# Patient Record
Sex: Male | Born: 1990 | Race: Black or African American | Hispanic: No | Marital: Single | Smoking: Never smoker
Health system: Southern US, Community
[De-identification: ages and names within clinical notes are randomized; demographics above are authoritative.]

---
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
## 2012-09-13 ENCOUNTER — Telehealth: Payer: Self-pay | Admitting: *Deleted

## 2012-09-13 ENCOUNTER — Ambulatory Visit: Payer: Self-pay | Admitting: Family Medicine

## 2012-09-13 DIAGNOSIS — Z0289 Encounter for other administrative examinations: Secondary | ICD-10-CM

## 2012-09-13 NOTE — Telephone Encounter (Signed)
No Show for New Pt Appt, 30 min.  I called only number on chart, asked him to call back with reason for missed appt.  Per Dr Caryl Never, if he does not call with a good reason, please block pt from re-scheduling with him.

## 2012-10-20 ENCOUNTER — Emergency Department (HOSPITAL_COMMUNITY): Payer: BC Managed Care – PPO

## 2012-10-20 ENCOUNTER — Emergency Department (HOSPITAL_COMMUNITY)
Admission: EM | Admit: 2012-10-20 | Discharge: 2012-10-20 | Disposition: A | Discharge status: Home / Self Care | Payer: BC Managed Care – PPO | Attending: Emergency Medicine | Admitting: Emergency Medicine

## 2012-10-20 ENCOUNTER — Encounter (HOSPITAL_COMMUNITY): Payer: Self-pay | Admitting: Emergency Medicine

## 2012-10-20 DIAGNOSIS — IMO0001 Reserved for inherently not codable concepts without codable children: Secondary | ICD-10-CM | POA: Insufficient documentation

## 2012-10-20 DIAGNOSIS — M25559 Pain in unspecified hip: Secondary | ICD-10-CM | POA: Insufficient documentation

## 2012-10-20 DIAGNOSIS — M25551 Pain in right hip: Secondary | ICD-10-CM

## 2012-10-20 MED ORDER — OXYCODONE-ACETAMINOPHEN 5-325 MG PO TABS
2.0000 | ORAL_TABLET | Freq: Once | ORAL | Status: AC
  Administered 2012-10-20: 2 via ORAL
  Filled 2012-10-20: qty 2

## 2012-10-20 MED ORDER — ONDANSETRON 4 MG PO TBDP
8.0000 mg | ORAL_TABLET | Freq: Once | ORAL | Status: AC
  Administered 2012-10-20: 8 mg via ORAL
  Filled 2012-10-20: qty 2

## 2012-10-20 MED ORDER — TRAMADOL HCL 50 MG PO TABS: 50.0000 mg | ORAL_TABLET | Freq: Four times a day (QID) | ORAL | Status: DC | PRN

## 2012-10-20 NOTE — ED Provider Notes (Signed)
Medical screening examination/treatment/procedure(s) were performed by non-physician practitioner and as supervising physician I was immediately available for consultation/collaboration.   Glynn Octave, MD 10/20/12 [phone]

## 2012-10-20 NOTE — ED Notes (Signed)
PT in xray; xray called to take pt to [facility].
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
## 2012-10-20 NOTE — ED Notes (Signed)
PT ambulated with baseline gait; VSS; A&Ox3; no signs of distress; respirations even and unlabored; skin warm and dry; no questions upon discharge.  
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
## 2013-02-05 ENCOUNTER — Emergency Department (HOSPITAL_COMMUNITY): Payer: BC Managed Care – PPO

## 2013-02-05 ENCOUNTER — Emergency Department (HOSPITAL_COMMUNITY)
Admission: EM | Admit: 2013-02-05 | Discharge: 2013-02-05 | Disposition: A | Discharge status: Home / Self Care | Payer: BC Managed Care – PPO | Attending: Emergency Medicine | Admitting: Emergency Medicine

## 2013-02-05 ENCOUNTER — Encounter (HOSPITAL_COMMUNITY): Payer: Self-pay | Admitting: Emergency Medicine

## 2013-02-05 DIAGNOSIS — S40012A Contusion of left shoulder, initial encounter: Secondary | ICD-10-CM

## 2013-02-05 DIAGNOSIS — Y9389 Activity, other specified: Secondary | ICD-10-CM | POA: Insufficient documentation

## 2013-02-05 DIAGNOSIS — S40019A Contusion of unspecified shoulder, initial encounter: Secondary | ICD-10-CM | POA: Insufficient documentation

## 2013-02-05 DIAGNOSIS — Y92410 Unspecified street and highway as the place of occurrence of the external cause: Secondary | ICD-10-CM | POA: Insufficient documentation

## 2013-02-05 MED ORDER — NAPROXEN 500 MG PO TABS: 500.0000 mg | ORAL_TABLET | Freq: Two times a day (BID) | ORAL | Status: DC

## 2013-02-05 MED ORDER — NAPROXEN 250 MG PO TABS
500.0000 mg | ORAL_TABLET | Freq: Once | ORAL | Status: AC
  Administered 2013-02-05: 500 mg via ORAL
  Filled 2013-02-05: qty 2

## 2013-02-05 MED ORDER — TRAMADOL HCL 50 MG PO TABS: 50.0000 mg | ORAL_TABLET | Freq: Four times a day (QID) | ORAL | Status: DC | PRN
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
## 2013-02-05 NOTE — ED Notes (Signed)
Pt. In xray 
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
## 2013-10-07 ENCOUNTER — Emergency Department (HOSPITAL_COMMUNITY)
Admission: EM | Admit: 2013-10-07 | Discharge: 2013-10-07 | Disposition: A | Discharge status: Home / Self Care | Payer: BC Managed Care – PPO | Attending: Emergency Medicine | Admitting: Emergency Medicine

## 2013-10-07 ENCOUNTER — Encounter (HOSPITAL_COMMUNITY): Payer: Self-pay | Admitting: Emergency Medicine

## 2013-10-07 DIAGNOSIS — R3915 Urgency of urination: Secondary | ICD-10-CM | POA: Insufficient documentation

## 2013-10-07 DIAGNOSIS — R300 Dysuria: Secondary | ICD-10-CM | POA: Diagnosis not present

## 2013-10-07 DIAGNOSIS — Z202 Contact with and (suspected) exposure to infections with a predominantly sexual mode of transmission: Secondary | ICD-10-CM | POA: Insufficient documentation

## 2013-10-07 DIAGNOSIS — Z792 Long term (current) use of antibiotics: Secondary | ICD-10-CM | POA: Insufficient documentation

## 2013-10-07 LAB — URINALYSIS, ROUTINE W REFLEX MICROSCOPIC
Bilirubin Urine: NEGATIVE
GLUCOSE, UA: NEGATIVE mg/dL
Ketones, ur: NEGATIVE mg/dL
Nitrite: NEGATIVE
PH: 6 (ref 5.0–8.0)
Protein, ur: NEGATIVE mg/dL
SPECIFIC GRAVITY, URINE: 1.011 (ref 1.005–1.030)
Urobilinogen, UA: 1 mg/dL (ref 0.0–1.0)

## 2013-10-07 LAB — URINE MICROSCOPIC-ADD ON

## 2013-10-07 LAB — HIV ANTIBODY (ROUTINE TESTING W REFLEX): HIV: NONREACTIVE

## 2013-10-07 LAB — RPR

## 2013-10-07 MED ORDER — CEFTRIAXONE SODIUM 250 MG IJ SOLR
250.0000 mg | Freq: Once | INTRAMUSCULAR | Status: AC
  Administered 2013-10-07: 250 mg via INTRAMUSCULAR
  Filled 2013-10-07: qty 250

## 2013-10-07 MED ORDER — LIDOCAINE HCL (PF) 1 % IJ SOLN
5.0000 mL | Freq: Once | INTRAMUSCULAR | Status: AC
  Administered 2013-10-07: 5 mL
  Filled 2013-10-07: qty 5

## 2013-10-07 MED ORDER — DOXYCYCLINE HYCLATE 100 MG PO CAPS: 100.0000 mg | ORAL_CAPSULE | Freq: Two times a day (BID) | ORAL | Status: DC

## 2013-10-07 MED ORDER — AZITHROMYCIN 250 MG PO TABS
1000.0000 mg | ORAL_TABLET | Freq: Once | ORAL | Status: AC
  Administered 2013-10-07: 1000 mg via ORAL
  Filled 2013-10-07: qty 4

## 2013-10-07 MED ORDER — CIPROFLOXACIN HCL 500 MG PO TABS: 500.0000 mg | ORAL_TABLET | Freq: Two times a day (BID) | ORAL | Status: DC

## 2013-10-07 NOTE — ED Notes (Signed)
Patient woke up past two days with irritation with urination and urgency

## 2013-10-07 NOTE — Discharge Instructions (Signed)
Please call your doctor for a followup appointment within 24-48 hours. When you talk to your doctor please let them know that you were seen in the emergency department and have them acquire all of your records so that they can discuss the findings with you and formulate a treatment plan to fully care for your new and ongoing problems. Please call and set-up an appointment with the Health Department regarding STD exposure Please rest and stay hydrated Please take antibiotics as prescribed - please take on a full stomach  Please avoid any sexual activity until lab results come back  Please have your partner(s) within the past 2 months be assessed for STDs Please continue to monitor symptoms closely and if symptoms are to worsen or change (fever greater than 101, chills, chest pain, shortness of breath, difficulty breathing, numbness, tingling, worsening or changes to pain pattern, weakness, blood in urine, blood in stools, black tarry stools, stomach pain, nausea, vomiting, diarrhea, swelling to the penis or testicles) please report back to the ED immediately   Dysuria Dysuria is the medical term for pain with urination. There are many causes for dysuria, but urinary tract infection is the most common. If a urinalysis was performed it can show that there is a urinary tract infection. A urine culture confirms that you or your child is sick. You will need to follow up with a healthcare provider because:  If a urine culture was done you will need to know the culture results and treatment recommendations.  If the urine culture was positive, you or your child will need to be put on antibiotics or know if the antibiotics prescribed are the right antibiotics for your urinary tract infection.  If the urine culture is negative (no urinary tract infection), then other causes may need to be explored or antibiotics need to be stopped. Today laboratory work may have been done and there does not seem to be an  infection. If cultures were done they will take at least 24 to 48 hours to be completed. Today x-rays may have been taken and they read as normal. No cause can be found for the problems. The x-rays may be re-read by a radiologist and you will be contacted if additional findings are made. You or your child may have been put on medications to help with this problem until you can see your primary caregiver. If the problems get better, see your primary caregiver if the problems return. If you were given antibiotics (medications which kill germs), take all of the mediations as directed for the full course of treatment.  If laboratory work was done, you need to find the results. Leave a telephone number where you can be reached. If this is not possible, make sure you find out how you are to get test results. HOME CARE INSTRUCTIONS   Drink lots of fluids. For adults, drink eight, 8 ounce glasses of clear juice or water a day. For children, replace fluids as suggested by your caregiver.  Empty the bladder often. Avoid holding urine for long periods of time.  After a bowel movement, women should cleanse front to back, using each tissue only once.  Empty your bladder before and after sexual intercourse.  Take all the medicine given to you until it is gone. You may feel better in a few days, but TAKE ALL MEDICINE.  Avoid caffeine, tea, alcohol and carbonated beverages, because they tend to irritate the bladder.  In men, alcohol may irritate the prostate.  Only  take over-the-counter or prescription medicines for pain, discomfort, or fever as directed by your caregiver.  If your caregiver has given you a follow-up appointment, it is very important to keep that appointment. Not keeping the appointment could result in a chronic or permanent injury, pain, and disability. If there is any problem keeping the appointment, you must call back to this facility for assistance. SEEK IMMEDIATE MEDICAL CARE IF:    Back pain develops.  A fever develops.  There is nausea (feeling sick to your stomach) or vomiting (throwing up).  Problems are no better with medications or are getting worse. MAKE SURE YOU:   Understand these instructions.  Will watch your condition.  Will get help right away if you are not doing well or get worse. Document Released: 12/14/2003 Document Revised: 06/09/2011 Document Reviewed: 10/21/2007 [employer] Patient Information 2015 [place], [place]. This information is not intended to replace advice given to you by your health care provider. Make sure you discuss any questions you have with your health care provider.  Sexually Transmitted Disease A sexually transmitted disease (STD) is a disease or infection that may be passed (transmitted) from person to person, usually during sexual activity. This may happen by way of saliva, semen, blood, vaginal mucus, or urine. Common STDs include:   Gonorrhea.   Chlamydia.   Syphilis.   HIV and AIDS.   Genital herpes.   Hepatitis B and C.   Trichomonas.   Human papillomavirus (HPV).   Pubic lice.   Scabies.  Mites.  Bacterial vaginosis. WHAT ARE CAUSES OF STDs? An STD may be caused by bacteria, a virus, or parasites. STDs are often transmitted during sexual activity if one person is infected. However, they may also be transmitted through nonsexual means. STDs may be transmitted after:   Sexual intercourse with an infected person.   Sharing sex toys with an infected person.   Sharing needles with an infected person or using unclean piercing or tattoo needles.  Having intimate contact with the genitals, mouth, or rectal areas of an infected person.   Exposure to infected fluids during birth. WHAT ARE THE SIGNS AND SYMPTOMS OF STDs? Different STDs have different symptoms. Some people may not have any symptoms. If symptoms are present, they may include:   Painful or bloody urination.   Pain in the  pelvis, abdomen, vagina, anus, throat, or eyes.   A skin rash, itching, or irritation.  Growths, ulcerations, blisters, or sores in the genital and anal areas.  Abnormal vaginal discharge with or without bad odor.   Penile discharge in men.   Fever.   Pain or bleeding during sexual intercourse.   Swollen glands in the groin area.   Yellow skin and eyes (jaundice). This is seen with hepatitis.   Swollen testicles.  Infertility.  Sores and blisters in the mouth. HOW ARE STDs DIAGNOSED? To make a diagnosis, your health care provider may:   Take a medical history.   Perform a physical exam.   Take a sample of any discharge to examine.  Swab the throat, cervix, opening to the penis, rectum, or vagina for testing.  Test a sample of your first morning urine.   Perform blood tests.   Perform a Pap test, if this applies.   Perform a colposcopy.   Perform a laparoscopy.  HOW ARE STDs TREATED? Treatment depends on the STD. Some STDs may be treated but not cured.   Chlamydia, gonorrhea, trichomonas, and syphilis can be cured with antibiotic medicine.  Genital herpes, hepatitis, and HIV can be treated, but not cured, with prescribed medicines. The medicines lessen symptoms.   Genital warts from HPV can be treated with medicine or by freezing, burning (electrocautery), or surgery. Warts may come back.   HPV cannot be cured with medicine or surgery. However, abnormal areas may be removed from the cervix, vagina, or vulva.   If your diagnosis is confirmed, your recent sexual partners need treatment. This is true even if they are symptom-free or have a negative culture or evaluation. They should not have sex until their health care providers say it is okay. HOW CAN I REDUCE MY RISK OF GETTING AN STD? Take these steps to reduce your risk of getting an STD:  Use latex condoms, dental dams, and water-soluble lubricants during sexual activity. Do not use  petroleum jelly or oils.  Avoid having multiple sex partners.  Do not have sex with someone who has other sex partners.  Do not have sex with anyone you do not know or who is at high risk for an STD.  Avoid risky sex practices that can break your skin.  Do not have sex if you have open sores on your mouth or skin.  Avoid drinking too much alcohol or taking illegal drugs. Alcohol and drugs can affect your judgment and put you in a vulnerable position.  Avoid engaging in oral and anal sex acts.  Get vaccinated for HPV and hepatitis. If you have not received these vaccines in the past, talk to your health care provider about whether one or both might be right for you.   If you are at risk of being infected with HIV, it is recommended that you take a prescription medicine daily to prevent HIV infection. This is called pre-exposure prophylaxis (PrEP). You are considered at risk if:  You are a man who has sex with other men (MSM).  You are a heterosexual man or woman and are sexually active with more than one partner.  You take drugs by injection.  You are sexually active with a partner who has HIV.  Talk with your health care provider about whether you are at high risk of being infected with HIV. If you choose to begin PrEP, you should first be tested for HIV. You should then be tested every 3 months for as long as you are taking PrEP.  WHAT SHOULD I DO IF I THINK I HAVE AN STD?  See your health care provider.   Tell your sexual partner(s). They should be tested and treated for any STDs.  Do not have sex until your health care provider says it is okay. WHEN SHOULD I GET IMMEDIATE MEDICAL CARE? Contact your health care provider right away if:   You have severe abdominal pain.  You are a man and notice swelling or pain in your testicles.  You are a woman and notice swelling or pain in your vagina. Document Released: 06/07/2002 Document Revised: 03/22/2013 Document Reviewed:  10/05/2012 [facility] Patient Information 2015 [place], [place]. This information is not intended to replace advice given to you by your health care provider. Make sure you discuss any questions you have with your health care provider.   Emergency Department Resource Guide 1) Find a Doctor and Pay Out of Pocket Although you won't have to find out who is covered by your insurance plan, it is a good idea to ask around and get recommendations. You will then need to call the office and see if the doctor you  have chosen will accept you as a new patient and what types of options they offer for patients who are self-pay. Some doctors offer discounts or will set up payment plans for their patients who do not have insurance, but you will need to ask so you aren't surprised when you get to your appointment.  2) Contact Your Local Health Department Not all health departments have doctors that can see patients for sick visits, but many do, so it is worth a call to see if yours does. If you don't know where your local health department is, you can check in your phone book. The CDC also has a tool to help you locate your state's health department, and many state websites also have listings of all of their local health departments.  3) Find a Walk-in Clinic If your illness is not likely to be very severe or complicated, you may want to try a walk in clinic. These are popping up all over the country in pharmacies, drugstores, and shopping centers. They're usually staffed by nurse practitioners or physician assistants that have been trained to treat common illnesses and complaints. They're usually fairly quick and inexpensive. However, if you have serious medical issues or chronic medical problems, these are probably not your best option.  No Primary Care Doctor: - Call Health Connect at  [phone] - they can help you locate a primary care doctor that  accepts your insurance, provides certain services, etc. - Physician  Referral Service- [phone]  Chronic Pain Problems: Organization         Address  Phone   Notes  [facility]  [phone] Patients need to be referred by their primary care doctor.   Medication Assistance: Organization         Address  Phone   Notes  [facility] Medication [facility] [address] [place], [place] [id number] [phone]) [phone] --Must be a resident of [place] -- Must have NO insurance coverage whatsoever (no Medicaid/ Medicare, etc.) -- The pt. MUST have a primary care doctor that directs their care regularly and follows them in the community   MedAssist  [phone]) [phone]   [employer]  [phone]) [phone]    Agencies that provide inexpensive medical care: Organization         Address  Phone   Notes  [facility]  [phone]   [facility]    [phone]) [phone]   [facility] [address] [place], [place] [address] [phone]   [facility] [address], [place] [phone]) [phone]   [facility]    [phone]   [facility]    [phone](336) [phone]   [facility] and [facility]  [address], [place] Phone:  [phone](336) [phone], Fax:  [phone] Hours of Operation:  9 am - 6 pm, M-F.  Also accepts Medicaid/Medicare and self-pay.  [facility]  [address], [place] Phone: [phone]) [phone], Fax: [phone]. Hours of Operation:  8:30 am - 5:30 pm, M-F.  Also accepts Medicaid and self-pay.  [facility] [place] [address], [place] Phone: [phone](336) [phone]   [facility] [address], [place], [place] [phone], Ext. [phone] Mondays & Thursdays: 7-9 AM.  First 15 patients are seen on a first come, first serve basis.    Medicaid-accepting [facility] Providers:  Organization  Address  Phone   Notes  [facility] [address],  [phone] Also accepts self-pay patients.  [facility] [address] Laurell Josephs [place], [place]  [phone]   [facility] [address], [place] [phone]   [facility] [address] [place] [phone]   Renaye Rakers [address], [place]   [phone] Only accepts [place] patients after they have their name applied to their card.   Self-Pay (no insurance) in [facility]:  Organization         Address  Phone   Notes  Sickle Cell Patients, [facility] [address] [place], [place] [phone]   [facility] [address] [place], [place] [phone]   [facility]  [address], [place] [phone]   [facility]/Dr. Osei-Bonsu  [address], [place] or [address], [place] [phone] Phone number for both [place] and [place] locations is the same.  [facility]gent [facility] and [facility] [address], [place] [phone]   [facility] [address] [place] or [address] [phone]   [facility] [address], [place] [phone], phone; [phone], fax Sees patients 1st and 3rd Saturday of every month.  Must not qualify for public or private insurance (i.e. Medicaid, Medicare, [place] Health Choice, Veterans' Benefits)  Household income should be no more than 200% of the poverty level The clinic cannot treat you if you are pregnant or think you are pregnant  Sexually transmitted diseases are not treated at the clinic.    Dental Care: Organization         Address  Phone  Notes  [facility] Department of [facility] [address] [place], [place] [phone] Accepts children up to age 51 who are enrolled  in [place] or [place] Health Choice; pregnant women with a Medicaid card; and children who have applied for Medicaid or [place] Health Choice, but were declined, whose parents can pay a reduced fee at time of service.  [facility] Department of [facility]  [address], [place] [phone] Accepts children up to age 23 who are enrolled in [place] or [place] Health Choice; pregnant women with a Medicaid card; and children who have applied for Medicaid or [place] Health Choice, but were declined, whose parents can pay a reduced fee at time of service.  [facility]  [address] [place], [place] [phone] Patients are seen by appointment only. Walk-ins are not accepted. [facility] will see patients 14 years of age and older. Monday - Tuesday (8am-5pm) Most Wednesdays (8:30-5pm) $30 per visit, cash only  [facility] Adult Dental Access PROGRAM  [address], [facility] [phone] Patients are seen by appointment only. Walk-ins are not accepted. [facility] will see patients 85 years of age and older. One Wednesday Evening (Monthly: Volunteer Based).  $30 per visit, cash only  [employer]  [phone] for adults; Children under age 74, call Graduate Pediatric Dentistry at [phone]. Children aged 49-14, please call [phone] to request a pediatric application.  Dental services are provided in all areas of dental care including  fillings, crowns and bridges, complete and partial dentures, implants, gum treatment, root canals, and extractions. Preventive care is also provided. Treatment is provided to both adults and children. Patients are selected via a lottery and there is often a waiting list.   [facility] [address], [place]  [phone] [email or web address]   [facility] [address] [place], [place] [phone], Ext. [phone] Second and Fourth Thursday of each month, opens at  6:30 AM; Clinic ends at 9 AM.  Patients are seen on a first-come first-served basis, and a limited number are seen during each clinic.   [facility]  [address] Ether Griffins [place], [place] [phone]   Eligibility Requirements You must have lived in [place], [place], or [place] counties for at least the last three months.   You cannot be eligible for state or federal sponsored [place], including [employer], [place], or [employer].   You generally cannot be eligible for healthcare insurance through your employer.    How to apply: Eligibility screenings are held every Tuesday and Wednesday afternoon from 1:00 pm until 4:00 pm. You do not need an appointment for the interview!  [facility] [address] [place], [place] [phone]   [facility] Health Department  [phone]   [facility] Health Department  [phone]   [facility] Health Department  [phone]    Behavioral Health Resources in the Community: Intensive Outpatient Programs Organization         Address  Phone  Notes  [facility] [address], [place], [place] [phone]   [facility] Outpatient [address] [place], [place] [phone]   [facility] [address], [place], [place]  [phone]   [facility] Mental Health [address],  [place], [place] [phone] or [phone]   Substance Abuse Resources Organization         Address  Phone  Notes  [facility] Drug Services  [phone]   [facility]  [phone]   [facility] [place]  [phone]   Floydene Flock  [phone]   Residential & Outpatient Substance Abuse Program  [phone]   Psychological Services Organization         Address  Phone  Notes  [facility]  [phone]   [facility]  [phone]   [facility] [address], [place]  [phone] or [phone]    Mobile Crisis Teams Organization         Address  Phone  Notes  [facility], Mobile Crisis Care Unit  4150844512   [facility]  [address] [place], [place] [phone]   Doristine Locks [address] [place] [phone]    Self-Help/Support Groups Organization         Address  Phone             Notes  [facility] [place] - variety of support groups  336- I7437963 Call for more information  Narcotics Anonymous (NA), [facility] [address], [employer] [place]  2 meetings at this location   Statistician         Address  Phone  Notes  [facility] [address],    [place]  [phone]   [facility]  [address] [place] [address], [place], [place] [phone]   [facility] [address] [place],  [place] [phone] Admissions: 8am-3pm M-F  [facility] [address]    [place], [place] [phone]   [facility] [address] [place], [place], [place] [phone]   [facility] [address],  [place], [place] [phone]   [facility] - Intensive Outpatient [address], [place], [place] [phone]   [facility] ([facility] [address] [place]  [place], [place] [phone] or ([phone]   [facility] ([facility]) [address], [place], [place] [phone] Accepts Medicaid  [facility] [place] [address],  [place] [phone] Substance Abuse/Addiction Treatment   [facility] Organization         Address  Phone  Notes  [facility]  5644661357   Angie Fava, PhD [address] Ervin Knack [place], [place]   7168[address] or [phone]   [facility]   [address] [place], [place] [phone]   [facility]  [address], [place], [place] [phone] Insurance/Medicaid/sponsorship through [facility] [address]                                    [place], [place] [phone] Therapy/tele-psych/case  [facility] [address], [place] [phone]    Dr. Lolly Mustache  [phone]   [facility]  [employer] [facility] Dept. 1) [address] [place] 2) [address] [place] 3)  [address], [place] [phone]  [phone]   [facility] Child Abuse Hotline (904)324-4248 or [phone] (After Hours)

## 2013-10-07 NOTE — ED Provider Notes (Signed)
CSN: [id number]     Arrival date & time 10/07/13  1116 History  This chart was scribed for non-physician practitioner, Raymon Mutton, PA-C,working with Gilda Crease, MD, by Karle Plumber, ED Scribe.  This patient was seen in room TR08C/TR08C and the patient's care was started at 2:55 PM.  Chief Complaint  Patient presents with  . SEXUALLY TRANSMITTED DISEASE   HPI HPI Comments:  Gary Fields is a 23 y.o. male who presents to the Emergency Department complaining of urgency and irritation while urinating that started yesterday. He reports associated white, cloudy penile discharge. He denies dysuria, hematuria, hematochezia, fever, chills, nausea, vomiting, abdominal pain, cough, congestion, penile or scrotal swelling, penile pain or testicular pain, rash or lesions, sore throat or otalgia. He reports being sexually active with the use of condoms. He has no medication allergies.   History reviewed. No pertinent past medical history. History reviewed. No pertinent past surgical history. No family history on file. History  Substance Use Topics  . Smoking status: Never Smoker   . Smokeless tobacco: Not on file  . Alcohol Use: No    Review of Systems  Constitutional: Negative for fever and chills.  HENT: Negative for congestion, ear pain and sore throat.   Respiratory: Negative for cough.   Genitourinary: Positive for urgency and discharge. Negative for hematuria, penile swelling, scrotal swelling and genital sores.  Skin: Negative for rash.  All other systems reviewed and are negative.   Allergies  Review of patient's allergies indicates no known allergies.  Home Medications   Prior to Admission medications   Medication Sig Start Date End Date Taking? Authorizing Provider  acetaminophen (TYLENOL) 325 MG tablet Take 650 mg by mouth every 6 (six) hours as needed for mild pain.   Yes Historical Provider, MD  ciprofloxacin (CIPRO) 500 MG tablet Take 1 tablet (500 mg  total) by mouth 2 (two) times daily. 10/07/13   Sherel Fennell, PA-C  doxycycline (VIBRAMYCIN) 100 MG capsule Take 1 capsule (100 mg total) by mouth 2 (two) times daily. One po bid x 7 days 10/07/13   Raymon Mutton, PA-C   Triage Vitals: BP 122/89  Pulse 62  Temp(Src) 98 F (36.7 C) (Oral)  Ht 5\' 9"  (1.753 m)  Wt 164 lb (74.39 kg)  BMI 24.21 kg/m2  SpO2 98% Physical Exam  Nursing note and vitals reviewed. Constitutional: He is oriented to person, place, and time. He appears well-developed and well-nourished.  HENT:  Head: Normocephalic and atraumatic.  Mouth/Throat: Oropharynx is clear and moist. No oropharyngeal exudate.  Eyes: Conjunctivae and EOM are normal. Pupils are equal, round, and reactive to light. Right eye exhibits no discharge. Left eye exhibits no discharge.  Neck: Normal range of motion. Neck supple. No tracheal deviation present.  Negative neck stiffness Negative nuchal rigidity  Negative cervical lymphadenopathy  Negative meningeal signs  Cardiovascular: Normal rate, regular rhythm and normal heart sounds.  Exam reveals no friction rub.   No murmur heard. Pulses:      Radial pulses are 2+ on the right side, and 2+ on the left side.  Pulmonary/Chest: Effort normal and breath sounds normal. No respiratory distress. He has no wheezes. He has no rales.  Abdominal: Soft. Bowel sounds are normal. He exhibits no distension. There is no tenderness. There is no rebound and no guarding.  Negative abdominal distention Bowel sounds normal active in all 4 quadrants Abdomen soft upon palpation Negative peritoneal signs Negative guarding or rigidity noted  Genitourinary:  Pelvic exam:  Negative swelling, erythema, inflammation, lesions, sores, deformities identified to the penis, scrotal sac. Negative pain upon palpation to the testicles. Negative swelling noted to the testicles. Negative changes to skin colored the scrotal sac. Negative inguinal lymphadenopathy bilaterally.  Negative penile discharge noted at the time. Negative active bleeding or drainage. Exam chaperoned with nurse.  Musculoskeletal: Normal range of motion. He exhibits no tenderness.  Full ROM to upper and lower extremities without difficulty noted, negative ataxia noted.  Lymphadenopathy:    He has no cervical adenopathy.  Neurological: He is alert and oriented to person, place, and time. No cranial nerve deficit. He exhibits normal muscle tone. Coordination normal.  Cranial nerves III-XII grossly intact Strength 5+/5+ to upper and lower extremities bilaterally with resistance applied, equal distribution noted Equal grip strength Negative facial drooping Negative slurred speech Negative aphasia Gait proper, proper balance - negative sway, negative drift, negative step-offs  Skin: Skin is warm and dry.  Psychiatric: He has a normal mood and affect. His behavior is normal.    ED Course  Procedures (including critical care time) DIAGNOSTIC STUDIES: Oxygen Saturation is 98% on RA, normal by my interpretation.   COORDINATION OF CARE: 2:59 PM- Will check labs and GC, chlamydia. Pt verbalizes understanding and agrees to plan.  Medications  cefTRIAXone (ROCEPHIN) injection 250 mg (250 mg Intramuscular Given 10/07/13 1553)  azithromycin (ZITHROMAX) tablet 1,000 mg (1,000 mg Oral Given 10/07/13 1554)  lidocaine (PF) (XYLOCAINE) 1 % injection 5 mL (5 mLs Other Given 10/07/13 1553)    Results for orders placed during the hospital encounter of 10/07/13  URINALYSIS, ROUTINE W REFLEX MICROSCOPIC      Result Value Ref Range   Color, Urine YELLOW  YELLOW   APPearance HAZY (*) CLEAR   Specific Gravity, Urine 1.011  1.005 - 1.030   pH 6.0  5.0 - 8.0   Glucose, UA NEGATIVE  NEGATIVE mg/dL   Hgb urine dipstick TRACE (*) NEGATIVE   Bilirubin Urine NEGATIVE  NEGATIVE   Ketones, ur NEGATIVE  NEGATIVE mg/dL   Protein, ur NEGATIVE  NEGATIVE mg/dL   Urobilinogen, UA 1.0  0.0 - 1.0 mg/dL   Nitrite  NEGATIVE  NEGATIVE   Leukocytes, UA MODERATE (*) NEGATIVE  RPR      Result Value Ref Range   RPR NON REAC  NON REAC  HIV ANTIBODY (ROUTINE TESTING)      Result Value Ref Range   HIV 1&2 Ab, 4th Generation NONREACTIVE  NONREACTIVE  URINE MICROSCOPIC-ADD ON      Result Value Ref Range   WBC, UA 21-50  <3 WBC/hpf   RBC / HPF 0-2  <3 RBC/hpf   Bacteria, UA RARE  RARE     Labs Review Labs Reviewed  URINALYSIS, ROUTINE W REFLEX MICROSCOPIC - Abnormal; Notable for the following:    APPearance HAZY (*)    Hgb urine dipstick TRACE (*)    Leukocytes, UA MODERATE (*)    All other components within normal limits  GC/CHLAMYDIA PROBE AMP  URINE CULTURE  RPR  HIV ANTIBODY (ROUTINE TESTING)  URINE MICROSCOPIC-ADD ON    Imaging Review No results found.   EKG Interpretation None      MDM   Final diagnoses:  Dysuria  Possible exposure to STD    Medications  cefTRIAXone (ROCEPHIN) injection 250 mg (250 mg Intramuscular Given 10/07/13 1553)  azithromycin (ZITHROMAX) tablet 1,000 mg (1,000 mg Oral Given 10/07/13 1554)  lidocaine (PF) (XYLOCAINE) 1 % injection 5 mL (5 mLs Other Given 10/07/13  1553)   I personally performed the services described in this documentation, which was scribed in my presence. The recorded information has been reviewed and is accurate.  Presenting to the ED with penile discharge as well as irritation with urination that started yesterday. Patient reported that he has noticed penile discharge. Reported that he is actually active, but does use protection. Urinalysis noted trace of hemoglobin with positive moderate leukocytes with white blood cell count of 21-50. Urine culture pending. HIV non-reactive. RPR non-reactive. GC Chlamydia probe pending. Patient treated prophylactically in ED setting with medications. Patient stable, afebrile. Patient not septic appearing. Discharged patient. Discharged patient with antibiotics. Referred patient to health department.  Discussed with patient safe sex. Discussed with patient for all partners within the past 2 months to get tested. Discussed with patient to closely monitor symptoms and if symptoms are to worsen or change to report back to the ED - strict return instructions given.  Patient agreed to plan of care, understood, all questions answered.   Raymon MuttonMarissa Jaileigh Weimer, PA-C 10/07/13 [phone]

## 2013-10-07 NOTE — ED Notes (Signed)
Declined W/C at D/C and was escorted to lobby by RN. 

## 2013-10-08 LAB — GC/CHLAMYDIA PROBE AMP
CT PROBE, AMP APTIMA: POSITIVE — AB
GC PROBE AMP APTIMA: NEGATIVE

## 2013-10-08 LAB — URINE CULTURE
Colony Count: NO GROWTH
Culture: NO GROWTH
SPECIAL REQUESTS: NORMAL

## 2013-10-09 ENCOUNTER — Telehealth (HOSPITAL_BASED_OUTPATIENT_CLINIC_OR_DEPARTMENT_OTHER): Payer: Self-pay | Admitting: Emergency Medicine

## 2013-10-09 NOTE — Telephone Encounter (Signed)
+  Chlamydia. Patient treated with Rocephin and Zithromax. [facility] faxed. 

## 2013-10-10 NOTE — ED Provider Notes (Signed)
Medical screening examination/treatment/procedure(s) were performed by non-physician practitioner and as supervising physician I was immediately available for consultation/collaboration.   EKG Interpretation None        Gregori Abril J. Lovis More, MD 10/10/13 1540 

## 2013-10-24 ENCOUNTER — Ambulatory Visit: Payer: BC Managed Care – PPO | Admitting: Physician Assistant

## 2013-10-26 ENCOUNTER — Ambulatory Visit: Payer: BC Managed Care – PPO | Admitting: Physician Assistant

## 2013-10-26 DIAGNOSIS — Z0289 Encounter for other administrative examinations: Secondary | ICD-10-CM

## 2014-05-09 ENCOUNTER — Emergency Department (HOSPITAL_COMMUNITY): Payer: No Typology Code available for payment source

## 2014-05-09 ENCOUNTER — Encounter (HOSPITAL_COMMUNITY): Payer: Self-pay | Admitting: *Deleted

## 2014-05-09 ENCOUNTER — Emergency Department (HOSPITAL_COMMUNITY)
Admission: EM | Admit: 2014-05-09 | Discharge: 2014-05-09 | Disposition: A | Discharge status: Home / Self Care | Payer: No Typology Code available for payment source | Attending: Emergency Medicine | Admitting: Emergency Medicine

## 2014-05-09 DIAGNOSIS — Y9289 Other specified places as the place of occurrence of the external cause: Secondary | ICD-10-CM | POA: Diagnosis not present

## 2014-05-09 DIAGNOSIS — S8991XA Unspecified injury of right lower leg, initial encounter: Secondary | ICD-10-CM | POA: Diagnosis not present

## 2014-05-09 DIAGNOSIS — Y998 Other external cause status: Secondary | ICD-10-CM | POA: Insufficient documentation

## 2014-05-09 DIAGNOSIS — S4991XA Unspecified injury of right shoulder and upper arm, initial encounter: Secondary | ICD-10-CM | POA: Diagnosis present

## 2014-05-09 DIAGNOSIS — R52 Pain, unspecified: Secondary | ICD-10-CM

## 2014-05-09 DIAGNOSIS — Y92410 Unspecified street and highway as the place of occurrence of the external cause: Secondary | ICD-10-CM | POA: Diagnosis not present

## 2014-05-09 DIAGNOSIS — S199XXA Unspecified injury of neck, initial encounter: Secondary | ICD-10-CM | POA: Insufficient documentation

## 2014-05-09 DIAGNOSIS — Y9389 Activity, other specified: Secondary | ICD-10-CM | POA: Diagnosis not present

## 2014-05-09 MED ORDER — NAPROXEN 500 MG PO TABS: 500.0000 mg | ORAL_TABLET | Freq: Two times a day (BID) | ORAL | Status: DC
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
## 2014-05-09 NOTE — Discharge Instructions (Signed)
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
## 2014-06-29 ENCOUNTER — Emergency Department (HOSPITAL_COMMUNITY)
Admission: EM | Admit: 2014-06-29 | Discharge: 2014-06-30 | Disposition: A | Discharge status: Home / Self Care | Payer: Self-pay | Attending: Emergency Medicine | Admitting: Emergency Medicine

## 2014-06-29 ENCOUNTER — Emergency Department (HOSPITAL_COMMUNITY): Payer: Self-pay

## 2014-06-29 ENCOUNTER — Encounter (HOSPITAL_COMMUNITY): Payer: Self-pay | Admitting: Emergency Medicine

## 2014-06-29 DIAGNOSIS — S3992XA Unspecified injury of lower back, initial encounter: Secondary | ICD-10-CM | POA: Insufficient documentation

## 2014-06-29 DIAGNOSIS — Y998 Other external cause status: Secondary | ICD-10-CM | POA: Insufficient documentation

## 2014-06-29 DIAGNOSIS — Y9389 Activity, other specified: Secondary | ICD-10-CM | POA: Insufficient documentation

## 2014-06-29 DIAGNOSIS — S161XXA Strain of muscle, fascia and tendon at neck level, initial encounter: Secondary | ICD-10-CM | POA: Insufficient documentation

## 2014-06-29 DIAGNOSIS — M546 Pain in thoracic spine: Secondary | ICD-10-CM

## 2014-06-29 DIAGNOSIS — Y92410 Unspecified street and highway as the place of occurrence of the external cause: Secondary | ICD-10-CM | POA: Insufficient documentation

## 2014-06-29 MED ORDER — HYDROCODONE-ACETAMINOPHEN 5-325 MG PO TABS
1.0000 | ORAL_TABLET | Freq: Once | ORAL | Status: AC
  Administered 2014-06-29: 1 via ORAL
  Filled 2014-06-29: qty 1
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
## 2014-06-30 MED ORDER — HYDROCODONE-ACETAMINOPHEN 5-325 MG PO TABS: 1.0000 | ORAL_TABLET | Freq: Four times a day (QID) | ORAL | Status: DC | PRN

## 2014-06-30 MED ORDER — CYCLOBENZAPRINE HCL 10 MG PO TABS
5.0000 mg | ORAL_TABLET | Freq: Once | ORAL | Status: AC
  Administered 2014-06-30: 5 mg via ORAL
  Filled 2014-06-30: qty 1

## 2014-06-30 MED ORDER — CYCLOBENZAPRINE HCL 10 MG PO TABS: 10.0000 mg | ORAL_TABLET | Freq: Three times a day (TID) | ORAL | Status: DC | PRN

## 2014-06-30 MED ORDER — NAPROXEN 500 MG PO TABS: 500.0000 mg | ORAL_TABLET | Freq: Two times a day (BID) | ORAL | Status: DC | PRN

## 2014-06-30 NOTE — Discharge Instructions (Signed)
Take naprosyn as directed for inflammation and pain with norco for breakthrough pain and flexeril for muscle relaxation. Do not drive or operate machinery with pain medication or muscle relaxation use. Ice to areas of soreness for the next few days and then may move to heat, no more than 20 minutes at a time for each. Expect to be sore for the next few days and follow up with primary care physician for recheck of ongoing symptoms. Return to ER for emergent changing or worsening of symptoms.     Cervical Sprain A cervical sprain is an injury in the neck in which the strong, fibrous tissues (ligaments) that connect your neck bones stretch or tear. Cervical sprains can range from mild to severe. Severe cervical sprains can cause the neck vertebrae to be unstable. This can lead to damage of the spinal cord and can result in serious nervous system problems. The amount of time it takes for a cervical sprain to get better depends on the cause and extent of the injury. Most cervical sprains heal in 1 to 3 weeks. CAUSES  Severe cervical sprains may be caused by:   Contact sport injuries (such as from football, rugby, wrestling, hockey, auto racing, gymnastics, diving, martial arts, or boxing).   Motor vehicle collisions.   Whiplash injuries. This is an injury from a sudden forward and backward whipping movement of the head and neck.  Falls.  Mild cervical sprains may be caused by:   Being in an awkward position, such as while cradling a telephone between your ear and shoulder.   Sitting in a chair that does not offer proper support.   Working at a poorly Marketing executive station.   Looking up or down for long periods of time.  SYMPTOMS   Pain, soreness, stiffness, or a burning sensation in the front, back, or sides of the neck. This discomfort may develop immediately after the injury or slowly, 24 hours or more after the injury.   Pain or tenderness directly in the middle of the back of  the neck.   Shoulder or upper back pain.   Limited ability to move the neck.   Headache.   Dizziness.   Weakness, numbness, or tingling in the hands or arms.   Muscle spasms.   Difficulty swallowing or chewing.   Tenderness and swelling of the neck.  DIAGNOSIS  Most of the time your health care provider can diagnose a cervical sprain by taking your history and doing a physical exam. Your health care provider will ask about previous neck injuries and any known neck problems, such as arthritis in the neck. X-rays may be taken to find out if there are any other problems, such as with the bones of the neck. Other tests, such as a CT scan or MRI, may also be needed.  TREATMENT  Treatment depends on the severity of the cervical sprain. Mild sprains can be treated with rest, keeping the neck in place (immobilization), and pain medicines. Severe cervical sprains are immediately immobilized. Further treatment is done to help with pain, muscle spasms, and other symptoms and may include:  Medicines, such as pain relievers, numbing medicines, or muscle relaxants.   Physical therapy. This may involve stretching exercises, strengthening exercises, and posture training. Exercises and improved posture can help stabilize the neck, strengthen muscles, and help stop symptoms from returning.  HOME CARE INSTRUCTIONS   Put ice on the injured area.   Put ice in a plastic bag.   Place a towel  between your skin and the bag.   Leave the ice on for 15-20 minutes, 3-4 times a day.   If your injury was severe, you may have been given a cervical collar to wear. A cervical collar is a two-piece collar designed to keep your neck from moving while it heals.  Do not remove the collar unless instructed by your health care provider.  If you have long hair, keep it outside of the collar.  Ask your health care provider before making any adjustments to your collar. Minor adjustments may be required  over time to improve comfort and reduce pressure on your chin or on the back of your head.  Ifyou are allowed to remove the collar for cleaning or bathing, follow your health care provider's instructions on how to do so safely.  Keep your collar clean by wiping it with mild soap and water and drying it completely. If the collar you have been given includes removable pads, remove them every 1-2 days and hand wash them with soap and water. Allow them to air dry. They should be completely dry before you wear them in the collar.  If you are allowed to remove the collar for cleaning and bathing, wash and dry the skin of your neck. Check your skin for irritation or sores. If you see any, tell your health care provider.  Do not drive while wearing the collar.   Only take over-the-counter or prescription medicines for pain, discomfort, or fever as directed by your health care provider.   Keep all follow-up appointments as directed by your health care provider.   Keep all physical therapy appointments as directed by your health care provider.   Make any needed adjustments to your workstation to promote good posture.   Avoid positions and activities that make your symptoms worse.   Warm up and stretch before being active to help prevent problems.  SEEK MEDICAL CARE IF:   Your pain is not controlled with medicine.   You are unable to decrease your pain medicine over time as planned.   Your activity level is not improving as expected.  SEEK IMMEDIATE MEDICAL CARE IF:   You develop any bleeding.  You develop stomach upset.  You have signs of an allergic reaction to your medicine.   Your symptoms get worse.   You develop new, unexplained symptoms.   You have numbness, tingling, weakness, or paralysis in any part of your body.  MAKE SURE YOU:   Understand these instructions.  Will watch your condition.  Will get help right away if you are not doing well or get  worse. Document Released: 01/12/2007 Document Revised: 03/22/2013 Document Reviewed: 09/22/2012 [facility] Patient Information 2015 [place], [place]. This information is not intended to replace advice given to you by your health care provider. Make sure you discuss any questions you have with your health care provider.  Cryotherapy Cryotherapy means treatment with cold. Ice or gel packs can be used to reduce both pain and swelling. Ice is the most helpful within the first 24 to 48 hours after an injury or flare-up from overusing a muscle or joint. Sprains, strains, spasms, burning pain, shooting pain, and aches can all be eased with ice. Ice can also be used when recovering from surgery. Ice is effective, has very few side effects, and is safe for most people to use. PRECAUTIONS  Ice is not a safe treatment option for people with:  Raynaud phenomenon. This is a condition affecting small blood vessels in  the extremities. Exposure to cold may cause your problems to return.  Cold hypersensitivity. There are many forms of cold hypersensitivity, including:  Cold urticaria. Red, itchy hives appear on the skin when the tissues begin to warm after being iced.  Cold erythema. This is a red, itchy rash caused by exposure to cold.  Cold hemoglobinuria. Red blood cells break down when the tissues begin to warm after being iced. The hemoglobin that carry oxygen are passed into the urine because they cannot combine with blood proteins fast enough.  Numbness or altered sensitivity in the area being iced. If you have any of the following conditions, do not use ice until you have discussed cryotherapy with your caregiver:  Heart conditions, such as arrhythmia, angina, or chronic heart disease.  High blood pressure.  Healing wounds or open skin in the area being iced.  Current infections.  Rheumatoid arthritis.  Poor circulation.  Diabetes. Ice slows the blood flow in the region it is applied. This is  beneficial when trying to stop inflamed tissues from spreading irritating chemicals to surrounding tissues. However, if you expose your skin to cold temperatures for too long or without the proper protection, you can damage your skin or nerves. Watch for signs of skin damage due to cold. HOME CARE INSTRUCTIONS Follow these tips to use ice and cold packs safely.  Place a dry or damp towel between the ice and skin. A damp towel will cool the skin more quickly, so you may need to shorten the time that the ice is used.  For a more rapid response, add gentle compression to the ice.  Ice for no more than 10 to 20 minutes at a time. The bonier the area you are icing, the less time it will take to get the benefits of ice.  Check your skin after 5 minutes to make sure there are no signs of a poor response to cold or skin damage.  Rest 20 minutes or more between uses.  Once your skin is numb, you can end your treatment. You can test numbness by very lightly touching your skin. The touch should be so light that you do not see the skin dimple from the pressure of your fingertip. When using ice, most people will feel these normal sensations in this order: cold, burning, aching, and numbness.  Do not use ice on someone who cannot communicate their responses to pain, such as small children or people with dementia. HOW TO MAKE AN ICE PACK Ice packs are the most common way to use ice therapy. Other methods include ice massage, ice baths, and cryosprays. Muscle creams that cause a cold, tingly feeling do not offer the same benefits that ice offers and should not be used as a substitute unless recommended by your caregiver. To make an ice pack, do one of the following:  Place crushed ice or a bag of frozen vegetables in a sealable plastic bag. Squeeze out the excess air. Place this bag inside another plastic bag. Slide the bag into a pillowcase or place a damp towel between your skin and the bag.  Mix 3 parts  water with 1 part rubbing alcohol. Freeze the mixture in a sealable plastic bag. When you remove the mixture from the freezer, it will be slushy. Squeeze out the excess air. Place this bag inside another plastic bag. Slide the bag into a pillowcase or place a damp towel between your skin and the bag. SEEK MEDICAL CARE IF:  You develop white  spots on your skin. This may give the skin a blotchy (mottled) appearance.  Your skin turns blue or pale.  Your skin becomes waxy or hard.  Your swelling gets worse. MAKE SURE YOU:   Understand these instructions.  Will watch your condition.  Will get help right away if you are not doing well or get worse. Document Released: 11/11/2010 Document Revised: 08/01/2013 Document Reviewed: 11/11/2010 [employer] Patient Information 2015 [place], [place]. This information is not intended to replace advice given to you by your health care provider. Make sure you discuss any questions you have with your health care provider.  Motor Vehicle Collision After a car crash (motor vehicle collision), it is normal to have bruises and sore muscles. The first 24 hours usually feel the worst. After that, you will likely start to feel better each day. HOME CARE  Put ice on the injured area.  Put ice in a plastic bag.  Place a towel between your skin and the bag.  Leave the ice on for 15-20 minutes, 03-04 times a day.  Drink enough fluids to keep your pee (urine) clear or pale yellow.  Do not drink alcohol.  Take a warm shower or bath 1 or 2 times a day. This helps your sore muscles.  Return to activities as told by your doctor. Be careful when lifting. Lifting can make neck or back pain worse.  Only take medicine as told by your doctor. Do not use aspirin. GET HELP RIGHT AWAY IF:   Your arms or legs tingle, feel weak, or lose feeling (numbness).  You have headaches that do not get better with medicine.  You have neck pain, especially in the middle of the back  of your neck.  You cannot control when you pee (urinate) or poop (bowel movement).  Pain is getting worse in any part of your body.  You are short of breath, dizzy, or pass out (faint).  You have chest pain.  You feel sick to your stomach (nauseous), throw up (vomit), or sweat.  You have belly (abdominal) pain that gets worse.  There is blood in your pee, poop, or throw up.  You have pain in your shoulder (shoulder strap areas).  Your problems are getting worse. MAKE SURE YOU:   Understand these instructions.  Will watch your condition.  Will get help right away if you are not doing well or get worse. Document Released: 09/03/2007 Document Revised: 06/09/2011 Document Reviewed: 08/14/2010 [facility] Patient Information 2015 [place], [place]. This information is not intended to replace advice given to you by your health care provider. Make sure you discuss any questions you have with your health care provider.

## 2015-02-12 ENCOUNTER — Emergency Department (HOSPITAL_COMMUNITY)
Admission: EM | Admit: 2015-02-12 | Discharge: 2015-02-12 | Disposition: A | Discharge status: Home / Self Care | Payer: BLUE CROSS/BLUE SHIELD | Attending: Emergency Medicine | Admitting: Emergency Medicine

## 2015-02-12 ENCOUNTER — Encounter (HOSPITAL_COMMUNITY): Payer: Self-pay | Admitting: Emergency Medicine

## 2015-02-12 DIAGNOSIS — M7651 Patellar tendinitis, right knee: Secondary | ICD-10-CM

## 2015-02-12 DIAGNOSIS — R2241 Localized swelling, mass and lump, right lower limb: Secondary | ICD-10-CM | POA: Diagnosis present

## 2015-02-12 DIAGNOSIS — M76891 Other specified enthesopathies of right lower limb, excluding foot: Secondary | ICD-10-CM | POA: Insufficient documentation

## 2015-02-12 NOTE — Discharge Instructions (Signed)
Tendinitis °Tendinitis is swelling and inflammation of the tendons. Tendons are band-like tissues that connect muscle to bone. Tendinitis commonly occurs in the:  °· Shoulders (rotator cuff). °· Heels (Achilles tendon). °· Elbows (triceps tendon). °CAUSES °Tendinitis is usually caused by overusing the tendon, muscles, and joints involved. When the tissue surrounding a tendon (synovium) becomes inflamed, it is called tenosynovitis. Tendinitis commonly develops in people whose jobs require repetitive motions. °SYMPTOMS °· Pain. °· Tenderness. °· Mild swelling. °DIAGNOSIS °Tendinitis is usually diagnosed by physical exam. Your health care provider may also order X-rays or other imaging tests. °TREATMENT °Your health care provider may recommend certain medicines or exercises for your treatment. °HOME CARE INSTRUCTIONS  °· Use a sling or splint for as long as directed by your health care provider until the pain decreases. °· Put ice on the injured area. °¨ Put ice in a plastic bag. °¨ Place a towel between your skin and the bag. °¨ Leave the ice on for 15-20 minutes, 3-4 times a day, or as directed by your health care provider. °· Avoid using the limb while the tendon is painful. Perform gentle range of motion exercises only as directed by your health care provider. Stop exercises if pain or discomfort increase, unless directed otherwise by your health care provider. °· Only take over-the-counter or prescription medicines for pain, discomfort, or fever as directed by your health care provider. °SEEK MEDICAL CARE IF:  °· Your pain and swelling increase. °· You develop new, unexplained symptoms, especially increased numbness in the hands. °MAKE SURE YOU:  °· Understand these instructions. °· Will watch your condition. °· Will get help right away if you are not doing well or get worse. °  °This information is not intended to replace advice given to you by your health care provider. Make sure you discuss any questions you  have with your health care provider. °  °Document Released: 03/14/2000 Document Revised: 04/07/2014 Document Reviewed: 06/03/2010 °Elsevier Interactive Patient Education ©2016 [employer] ° °

## 2015-02-12 NOTE — ED Provider Notes (Signed)
CSN: [id number]     Arrival date & time 02/12/15  1138 History  By signing my name below, I, Placido SouLogan Joldersma, attest that this documentation has been prepared under the direction and in the presence of Newell RubbermaidJeffrey Kaylah Chiasson, PA-C. Electronically Signed: Placido SouLogan Joldersma, ED Scribe. 02/12/2015. 12:18 PM.   Chief Complaint  Patient presents with  . lump on knee    The history is provided by the patient. No language interpreter was used.    HPI Comments:  Gary Fields is a 24 y.o. male who presents to the Emergency Department complaining of a point of swelling below his right anterior knee with onset 6 months ago. He notes that the point of swelling has been constant further noting mild pain when palpating the affected region. Pt notes playing basketball regularly and used to work in a warehouse as a Nature conservation officerlaborer. He denies any associated symptoms at this time. Pain made worse with squatting. No other complaints  History reviewed. No pertinent past medical history. History reviewed. No pertinent past surgical history. No family history on file. Social History  Substance Use Topics  . Smoking status: Never Smoker   . Smokeless tobacco: None  . Alcohol Use: No    Review of Systems A complete 10 system review of systems was obtained and all systems are negative except as noted in the HPI and PMH.   Allergies  Review of patient's allergies indicates no known allergies.  Home Medications   Prior to Admission medications   Medication Sig Start Date End Date Taking? Authorizing Provider  acetaminophen (TYLENOL) 325 MG tablet Take 650 mg by mouth every 6 (six) hours as needed for mild pain.    Historical Provider, MD  cyclobenzaprine (FLEXERIL) 10 MG tablet Take 1 tablet (10 mg total) by mouth 3 (three) times daily as needed for muscle spasms. 06/30/14   Mercedes Camprubi-Soms, PA-C  HYDROcodone-acetaminophen (NORCO) 5-325 MG per tablet Take 1 tablet by mouth every 6 (six) hours as needed for severe  pain. 06/30/14   Mercedes Camprubi-Soms, PA-C  naproxen (NAPROSYN) 500 MG tablet Take 1 tablet (500 mg total) by mouth 2 (two) times daily. Patient not taking: Reported on 06/29/2014 05/09/14   Linwood DibblesJon Knapp, MD  naproxen (NAPROSYN) 500 MG tablet Take 1 tablet (500 mg total) by mouth 2 (two) times daily as needed for mild pain, moderate pain or headache (TAKE WITH MEALS.). 06/30/14   Mercedes Camprubi-Soms, PA-C   BP 109/61 mmHg  Pulse 83  Temp(Src) 98.3 F (36.8 C) (Oral)  Resp 18  SpO2 97%   Physical Exam  Constitutional: He is oriented to person, place, and time. He appears well-developed and well-nourished.  HENT:  Head: Normocephalic and atraumatic.  Mouth/Throat: No oropharyngeal exudate.  Neck: Normal range of motion. No tracheal deviation present.  Cardiovascular: Normal rate.   Pulmonary/Chest: Effort normal. No respiratory distress.  Abdominal: Soft. There is no tenderness.  Musculoskeletal: Normal range of motion.  Firm tender enlargement of the tibular tuberosity on the right; no signs of infection including redness, swelling or warmth to touch; no signs of cellulitis; non-fluctuant; knee normal and equal bilateral   Neurological: He is alert and oriented to person, place, and time.  Skin: Skin is warm and dry. He is not diaphoretic.  Psychiatric: He has a normal mood and affect. His behavior is normal.  Nursing note and vitals reviewed.   ED Course  Procedures  DIAGNOSTIC STUDIES: Oxygen Saturation is 97% on RA, normal by my interpretation.  COORDINATION OF CARE: 12:15 PM Pt presents today due to a small point of swelling inferior to his right knee. Discussed treatment plan with pt at bedside including recommendations for the RICE method and OTC orthopaedics for use as needed. Return precautions noted. Pt agreed to plan.  Labs Review Labs Reviewed - No data to display  Imaging Review No results found.   EKG Interpretation None      MDM   Final diagnoses:   Patellar tendinitis of right knee    Labs: none indicated  Imaging: none indicated  Consults: none  Therapeutics:   Assessment/plan: 24 year old male presents today with right anterior knee pain. Patient has enlarged tibial tuberosity, consistent with patellar tendinitis and avulsion of the tuberosity. Patient does not fit typical age range for Osgood-Schlatter disease, although his presentation is very similar. Patient is active place basketball, he worsens with squatting. This pain is been present for the last 6 months, no significant changes. Patient instructed to use ice, knee band for relief of pain. Follow up with PCP if symptom do not improve or worsen. No systemic symptoms.     I personally performed the services described in this documentation, which was scribed in my presence. The recorded information has been reviewed and is accurate.    Eyvonne Mechanic, PA-C 02/12/15 1225  Cathren Laine, MD 02/12/15 [id number]

## 2015-02-12 NOTE — ED Notes (Signed)
Pt c/o lump on right knee for 6 months.  Pt states that today he woke up and the pain made him worry enough to come in to get it checked out.

## 2018-02-15 ENCOUNTER — Ambulatory Visit: Payer: Self-pay | Admitting: Internal Medicine

## 2018-02-15 DIAGNOSIS — Z0289 Encounter for other administrative examinations: Secondary | ICD-10-CM

## 2018-02-15 NOTE — Progress Notes (Deleted)
New Patient Office Visit  Subjective:  Patient ID: Gary Fields, male    DOB: [birth year]  Age: 27 y.o. MRN: [id number]  CC: No chief complaint on file.   HPI Gary Fields presents for to establish new patient   [place]   No past medical history on file.  No past surgical history on file.  No family history on file.  Social History   Socioeconomic History  . Marital status: Single    Spouse name: Not on file  . Number of children: Not on file  . Years of education: Not on file  . Highest education level: Not on file  Occupational History  . Not on file  Social Needs  . Financial resource strain: Not on file  . Food insecurity:    Worry: Not on file    Inability: Not on file  . Transportation needs:    Medical: Not on file    Non-medical: Not on file  Tobacco Use  . Smoking status: Never Smoker  Substance and Sexual Activity  . Alcohol use: No  . Drug use: Yes    Types: Marijuana  . Sexual activity: Not on file  Lifestyle  . Physical activity:    Days per week: Not on file    Minutes per session: Not on file  . Stress: Not on file  Relationships  . Social connections:    Talks on phone: Not on file    Gets together: Not on file    Attends religious service: Not on file    Active member of club or organization: Not on file    Attends meetings of clubs or organizations: Not on file    Relationship status: Not on file  . Intimate partner violence:    Fear of current or ex partner: Not on file    Emotionally abused: Not on file    Physically abused: Not on file    Forced sexual activity: Not on file  Other Topics Concern  . Not on file  Social History Narrative  . Not on file    ROS Review of Systems   ROS:  GEN/ HEENT: No fever, significant weight changes sweats headaches vision problems hearing changes, CV/ PULM; No chest pain shortness of breath cough, syncope,edema  change in exercise tolerance. GI /GU: No  adominal pain, vomiting, change in bowel habits. No blood in the stool. No significant GU symptoms. SKIN/HEME: ,no acute skin rashes suspicious lesions or bleeding. No lymphadenopathy, nodules, masses.  NEURO/ PSYCH:  No neurologic signs such as weakness numbness. No depression anxiety. IMM/ Allergy: No unusual infections.  Allergy .   REST of 12 system review negative except as per HPI   Objective:   Today's Vitals: There were no vitals taken for this visit.  Physical Exam  Physical Exam: Vital signs reviewed [name] is a well-developed well-nourished alert cooperative  White male  who appears   stated age in no acute distress.  HEENT: normocephalic  traumatic , Eyes: PERRL EOM's full, conjunctiva clear, Nares: patent no deformity discharge or tenderness., Ears: no deformity EAC's clear TMs with normal landmarks. Mouth: clear OP, no lesions, edema.  Moist mucous membranes. Dentition in adequate repair. NECK: supple without masses, thyromegaly or bruits. CHEST/PULM:  Clear to auscultation and percussion breath sounds equal no wheeze , rales or rhonchi. No chest wall deformities or tenderness. CV: PMI is nondisplaced, S1 S2 no gallops, murmurs, rubs. Peripheral pulses are full without  delay.No JVD .  ABDOMEN: Bowel sounds normal nontender  No guard or rebound, no hepato splenomegal no CVA tenderness.  No hernia. Extremtities:  No clubbing cyanosis or edema, no acute joint swelling or redness no focal atrophy NEURO:  Oriented x3, cranial nerves 3-12 appear to be intact, no obvious focal weakness,gait within normal limits no abnormal reflexes or asymmetrical SKIN: No acute rashes normal turgor, color, no bruising or petechiae. PSYCH: Oriented, good eye contact, no obvious depression anxiety, cognition and judgment appear normal. LN:  No cervical axillary or inguinal adenopathy         Assessment & Plan:   Problem List Items Addressed This Visit    None    Visit Diagnoses     Encounter to establish care    -  Primary      Outpatient Encounter Medications as of 02/15/2018  Medication Sig  . acetaminophen (TYLENOL) 325 MG tablet Take 650 mg by mouth every 6 (six) hours as needed for mild pain.  . cyclobenzaprine (FLEXERIL) 10 MG tablet Take 1 tablet (10 mg total) by mouth 3 (three) times daily as needed for muscle spasms.  Marland Kitchen. HYDROcodone-acetaminophen (NORCO) 5-325 MG per tablet Take 1 tablet by mouth every 6 (six) hours as needed for severe pain.  . naproxen (NAPROSYN) 500 MG tablet Take 1 tablet (500 mg total) by mouth 2 (two) times daily. (Patient not taking: Reported on 06/29/2014)  . naproxen (NAPROSYN) 500 MG tablet Take 1 tablet (500 mg total) by mouth 2 (two) times daily as needed for mild pain, moderate pain or headache (TAKE WITH MEALS.).   No facility-administered encounter medications on file as of 02/15/2018.     Follow-up: No follow-ups on file.   Gary Fields , MD

## 2018-04-26 ENCOUNTER — Emergency Department
Admission: EM | Admit: 2018-04-26 | Discharge: 2018-04-26 | Disposition: A | Discharge status: Home / Self Care | Payer: BLUE CROSS/BLUE SHIELD | Attending: Emergency Medicine | Admitting: Emergency Medicine

## 2018-04-26 ENCOUNTER — Encounter: Payer: Self-pay | Admitting: Emergency Medicine

## 2018-04-26 ENCOUNTER — Other Ambulatory Visit: Payer: Self-pay

## 2018-04-26 DIAGNOSIS — F1210 Cannabis abuse, uncomplicated: Secondary | ICD-10-CM | POA: Diagnosis not present

## 2018-04-26 DIAGNOSIS — K2900 Acute gastritis without bleeding: Secondary | ICD-10-CM | POA: Diagnosis not present

## 2018-04-26 DIAGNOSIS — R109 Unspecified abdominal pain: Secondary | ICD-10-CM | POA: Insufficient documentation

## 2018-04-26 DIAGNOSIS — R112 Nausea with vomiting, unspecified: Secondary | ICD-10-CM | POA: Diagnosis present

## 2018-04-26 LAB — COMPREHENSIVE METABOLIC PANEL
ALT: 37 U/L (ref 0–44)
AST: 37 U/L (ref 15–41)
Albumin: 5.3 g/dL — ABNORMAL HIGH (ref 3.5–5.0)
Alkaline Phosphatase: 82 U/L (ref 38–126)
Anion gap: 12 (ref 5–15)
BUN: 16 mg/dL (ref 6–20)
CO2: 24 mmol/L (ref 22–32)
Calcium: 10 mg/dL (ref 8.9–10.3)
Chloride: 101 mmol/L (ref 98–111)
Creatinine, Ser: 1.05 mg/dL (ref 0.61–1.24)
GFR calc non Af Amer: >60 mL/min (ref >60)
Glucose, Bld: 104 mg/dL — ABNORMAL HIGH (ref 70–99)
POTASSIUM: 3.7 mmol/L (ref 3.5–5.1)
Sodium: 137 mmol/L (ref 135–145)
Total Bilirubin: 1.1 mg/dL (ref 0.3–1.2)
Total Protein: 9.2 g/dL — ABNORMAL HIGH (ref 6.5–8.1)

## 2018-04-26 LAB — URINALYSIS, COMPLETE (UACMP) WITH MICROSCOPIC
BILIRUBIN URINE: NEGATIVE
Glucose, UA: NEGATIVE mg/dL
Hgb urine dipstick: NEGATIVE
Ketones, ur: 20 mg/dL — AB
Leukocytes, UA: NEGATIVE
Nitrite: NEGATIVE
Protein, ur: 100 mg/dL — AB
Specific Gravity, Urine: 1.026 (ref 1.005–1.030)
pH: 6 (ref 5.0–8.0)

## 2018-04-26 LAB — LIPASE, BLOOD: Lipase: 26 U/L (ref 11–51)

## 2018-04-26 LAB — CBC
HCT: 41.4 % (ref 39.0–52.0)
HEMOGLOBIN: 14.7 g/dL (ref 13.0–17.0)
MCH: 31.2 pg (ref 26.0–34.0)
MCHC: 35.5 g/dL (ref 30.0–36.0)
MCV: 87.9 fL (ref 80.0–100.0)
Platelets: 270 10*3/uL (ref 150–400)
RBC: 4.71 MIL/uL (ref 4.22–5.81)
RDW: 11.9 % (ref 11.5–15.5)
WBC: 8 10*3/uL (ref 4.0–10.5)
nRBC: 0 % (ref 0.0–0.2)

## 2018-04-26 MED ORDER — ONDANSETRON 4 MG PO TBDP
4.0000 mg | ORAL_TABLET | Freq: Once | ORAL | Status: AC
  Administered 2018-04-26: 4 mg via ORAL

## 2018-04-26 MED ORDER — ONDANSETRON HCL 4 MG/2ML IJ SOLN
4.0000 mg | Freq: Once | INTRAMUSCULAR | Status: AC
  Administered 2018-04-26: 4 mg via INTRAVENOUS

## 2018-04-26 MED ORDER — ONDANSETRON 4 MG PO TBDP: 4.0000 mg | ORAL_TABLET | Freq: Three times a day (TID) | ORAL | 0 refills | Status: DC | PRN

## 2018-04-26 MED ORDER — SODIUM CHLORIDE 0.9 % IV BOLUS
1000.0000 mL | Freq: Once | INTRAVENOUS | Status: AC
  Administered 2018-04-26: 1000 mL via INTRAVENOUS

## 2018-04-26 MED ORDER — SODIUM CHLORIDE 0.9% FLUSH: 3.0000 mL | Freq: Once | INTRAVENOUS | Status: DC

## 2018-04-26 MED ORDER — ONDANSETRON 4 MG PO TBDP
ORAL_TABLET | ORAL | Status: AC
  Filled 2018-04-26: qty 1

## 2018-04-26 MED ORDER — ONDANSETRON HCL 4 MG/2ML IJ SOLN
INTRAMUSCULAR | Status: AC
  Administered 2018-04-26: 4 mg via INTRAVENOUS
  Filled 2018-04-26: qty 2

## 2018-04-26 NOTE — ED Triage Notes (Signed)
First NUrse Note:  C/O emesis x 4 days.  States has also been vomiting blood.    AAOx3.  Skin warm and dry. NAD.

## 2018-04-26 NOTE — ED Provider Notes (Signed)
[facility] Emergency Department Provider Note   ____________________________________________    I have reviewed the triage vital signs and the nursing notes.   HISTORY  Chief Complaint Abdominal Pain     HPI Gary Fields is a 28 y.o. male who presents with complaints of nausea vomiting and abdominal cramping.  Patient reports he is flulike symptoms x4 days, seemed to be getting better but then developed nausea and vomiting with multiple episodes of vomiting today.  Not tolerating p.o.'s.  Denies fevers or chills.  Still a small amount of blood in his vomitus today and became concerned and came to the emergency department.  No dizziness or lightheadedness.  No recent travel.  Has not taken anything for this.  History reviewed. No pertinent past medical history.  There are no active problems to display for this patient.   History reviewed. No pertinent surgical history.  Prior to Admission medications   Medication Sig Start Date End Date Taking? Authorizing Provider  acetaminophen (TYLENOL) 325 MG tablet Take 650 mg by mouth every 6 (six) hours as needed for mild pain.    [provider]  cyclobenzaprine (FLEXERIL) 10 MG tablet Take 1 tablet (10 mg total) by mouth 3 (three) times daily as needed for muscle spasms. 06/30/14   Street, Gidd[place], PA-C  HYDROcodone-acetaminophen (NORCO) 5-325 MG per tablet Take 1 tablet by mouth every 6 (six) hours as needed for severe pain. 06/30/14   Street, Mercedes, PA-C  naproxen (NAPROSYN) 500 MG tablet Take 1 tablet (500 mg total) by mouth 2 (two) times daily. Patient not taking: Reported on 06/29/2014 05/09/14   Linwood Dibbles, MD  naproxen (NAPROSYN) 500 MG tablet Take 1 tablet (500 mg total) by mouth 2 (two) times daily as needed for mild pain, moderate pain or headache (TAKE WITH MEALS.). 06/30/14   Street, Mercedes, PA-C  ondansetron (ZOFRAN ODT) 4 MG disintegrating tablet Take 1 tablet (4 mg total) by mouth every  8 (eight) hours as needed for nausea or vomiting. 04/26/18   Jene Every, MD     Allergies Patient has no known allergies.  No family history on file.  Social History Social History   Tobacco Use  . Smoking status: Never Smoker  Substance Use Topics  . Alcohol use: No  . Drug use: Yes    Types: Marijuana    Review of Systems  Constitutional: No fever/chills Eyes: No visual changes.  ENT: No sore throat. Cardiovascular: Denies chest pain. Respiratory: Denies shortness of breath. Gastrointestinal: As above Genitourinary: Negative for dysuria. Musculoskeletal: Negative for back pain. Skin: Negative for rash. Neurological: Negative for headaches    ____________________________________________   PHYSICAL EXAM:  VITAL SIGNS: ED Triage Vitals  Enc Vitals Group     BP 04/26/18 1759 116/85     Pulse Rate 04/26/18 1759 90     Resp --      Temp 04/26/18 1759 98.6 F (37 C)     Temp Source 04/26/18 1759 Oral     SpO2 04/26/18 1759 97 %     Weight 04/26/18 1800 86.2 kg (190 lb)     Height 04/26/18 1800 1.753 m (5\' 9" )     Head Circumference --      Peak Flow --      Pain Score 04/26/18 1802 10     Pain Loc --      Pain Edu? --      Excl. in GC? --     Constitutional: Alert and  oriented.   Nose: No congestion/rhinnorhea. Mouth/Throat: Mucous membranes are moist.    Cardiovascular: Normal rate, regular rhythm. Grossly normal heart sounds.  Good peripheral circulation. Respiratory: Normal respiratory effort.  No retractions. Lungs CTAB. Gastrointestinal: Soft and nontender. No distention.  No CVA tenderness.  Reassuring exam Musculoskeletal: .  Warm and well perfused Neurologic:  Normal speech and language. No gross focal neurologic deficits are appreciated.  Skin:  Skin is warm, dry and intact. No rash noted. Psychiatric: Mood and affect are normal. Speech and behavior are normal.  ____________________________________________   LABS (all labs ordered are  listed, but only abnormal results are displayed)  Labs Reviewed  COMPREHENSIVE METABOLIC PANEL - Abnormal; Notable for the following components:      Result Value   Glucose, Bld 104 (*)    Total Protein 9.2 (*)    Albumin 5.3 (*)    All other components within normal limits  URINALYSIS, COMPLETE (UACMP) WITH MICROSCOPIC - Abnormal; Notable for the following components:   Color, Urine YELLOW (*)    APPearance CLEAR (*)    Ketones, ur 20 (*)    Protein, ur 100 (*)    Bacteria, UA RARE (*)    All other components within normal limits  LIPASE, BLOOD  CBC   ____________________________________________  EKG   ____________________________________________  RADIOLOGY  None ____________________________________________   PROCEDURES  Procedure(s) performed: No  Procedures   Critical Care performed: No ____________________________________________   INITIAL IMPRESSION / ASSESSMENT AND PLAN / ED COURSE  Pertinent labs & imaging results that were available during my care of the patient were reviewed by me and considered in my medical decision making (see chart for details).  Patient symptoms most consistent with likely viral gastritis/gastroenteritis.  Will treat with IV fluids, IV Zofran and reevaluate.  Lab work thus far is quite reassuring.  ----------------------------------------- 9:20 PM on 04/26/2018 -----------------------------------------  Patient much improved after IV fluids, and Zofran, tolerating p.o.'s now appropriate for discharge with supportive care, return precautions discussed    ____________________________________________   FINAL CLINICAL IMPRESSION(S) / ED DIAGNOSES  Final diagnoses:  Acute gastritis without hemorrhage, unspecified gastritis type        Note:  This document was prepared using Dragon voice recognition software and may include unintentional dictation errors.   Jene Every, MD 04/26/18 2121

## 2018-04-26 NOTE — ED Triage Notes (Signed)
Nausea and vomiting x 3 days. States thinks he saw blood today.

## 2020-10-07 ENCOUNTER — Other Ambulatory Visit: Payer: Self-pay

## 2020-10-07 ENCOUNTER — Encounter: Payer: Self-pay | Admitting: Emergency Medicine

## 2020-10-07 ENCOUNTER — Emergency Department
Admission: EM | Admit: 2020-10-07 | Discharge: 2020-10-07 | Disposition: A | Discharge status: Home / Self Care | Payer: No Typology Code available for payment source | Attending: Emergency Medicine | Admitting: Emergency Medicine

## 2020-10-07 ENCOUNTER — Emergency Department: Payer: No Typology Code available for payment source

## 2020-10-07 DIAGNOSIS — S29012A Strain of muscle and tendon of back wall of thorax, initial encounter: Secondary | ICD-10-CM | POA: Insufficient documentation

## 2020-10-07 DIAGNOSIS — S29019A Strain of muscle and tendon of unspecified wall of thorax, initial encounter: Secondary | ICD-10-CM

## 2020-10-07 DIAGNOSIS — S161XXA Strain of muscle, fascia and tendon at neck level, initial encounter: Secondary | ICD-10-CM

## 2020-10-07 DIAGNOSIS — S39012A Strain of muscle, fascia and tendon of lower back, initial encounter: Secondary | ICD-10-CM | POA: Diagnosis not present

## 2020-10-07 DIAGNOSIS — Y92410 Unspecified street and highway as the place of occurrence of the external cause: Secondary | ICD-10-CM | POA: Diagnosis not present

## 2020-10-07 DIAGNOSIS — S199XXA Unspecified injury of neck, initial encounter: Secondary | ICD-10-CM | POA: Diagnosis present

## 2020-10-07 MED ORDER — NAPROXEN 500 MG PO TABS: 500.0000 mg | ORAL_TABLET | Freq: Two times a day (BID) | ORAL | 0 refills | Status: AC
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
## 2020-10-07 NOTE — Discharge Instructions (Addendum)
Follow-up with your primary care provider or urgent care if any continued problems.  Begin taking medication as directed.  You may use ice or heat to your muscles as needed for discomfort.  Even with medication you will be sore for approximately 4 days.  Move frequently to decrease the amount of soreness that you have rather than lay in bed or on the sofa.

## 2020-10-07 NOTE — ED Notes (Signed)
Pt calm , collective, denied pain or sob . Pt ambulatory upon discharge  
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
## 2021-08-28 ENCOUNTER — Emergency Department: Payer: BLUE CROSS/BLUE SHIELD

## 2021-08-28 ENCOUNTER — Emergency Department
Admission: EM | Admit: 2021-08-28 | Discharge: 2021-08-28 | Disposition: A | Discharge status: Home / Self Care | Payer: BLUE CROSS/BLUE SHIELD | Attending: Emergency Medicine | Admitting: Emergency Medicine

## 2021-08-28 DIAGNOSIS — Y92410 Unspecified street and highway as the place of occurrence of the external cause: Secondary | ICD-10-CM | POA: Insufficient documentation

## 2021-08-28 DIAGNOSIS — R930 Abnormal findings on diagnostic imaging of skull and head, not elsewhere classified: Secondary | ICD-10-CM | POA: Diagnosis not present

## 2021-08-28 DIAGNOSIS — R109 Unspecified abdominal pain: Secondary | ICD-10-CM | POA: Insufficient documentation

## 2021-08-28 DIAGNOSIS — M791 Myalgia, unspecified site: Secondary | ICD-10-CM | POA: Insufficient documentation

## 2021-08-28 DIAGNOSIS — M549 Dorsalgia, unspecified: Secondary | ICD-10-CM | POA: Diagnosis not present

## 2021-08-28 DIAGNOSIS — M25551 Pain in right hip: Secondary | ICD-10-CM | POA: Diagnosis not present

## 2021-08-28 DIAGNOSIS — R079 Chest pain, unspecified: Secondary | ICD-10-CM | POA: Diagnosis not present

## 2021-08-28 DIAGNOSIS — M7918 Myalgia, other site: Secondary | ICD-10-CM

## 2021-08-28 LAB — COMPREHENSIVE METABOLIC PANEL
ALT: 29 U/L (ref 0–44)
AST: 24 U/L (ref 15–41)
Albumin: 4.3 g/dL (ref 3.5–5.0)
Alkaline Phosphatase: 88 U/L (ref 38–126)
Anion gap: 9 (ref 5–15)
BUN: 15 mg/dL (ref 6–20)
CO2: 24 mmol/L (ref 22–32)
Calcium: 9 mg/dL (ref 8.9–10.3)
Chloride: 106 mmol/L (ref 98–111)
Creatinine, Ser: 0.77 mg/dL (ref 0.61–1.24)
GFR, Estimated: >60 mL/min (ref >60)
Glucose, Bld: 97 mg/dL (ref 70–99)
Potassium: 4.1 mmol/L (ref 3.5–5.1)
Sodium: 139 mmol/L (ref 135–145)
Total Bilirubin: 0.9 mg/dL (ref 0.3–1.2)
Total Protein: 7.5 g/dL (ref 6.5–8.1)

## 2021-08-28 LAB — CBC WITH DIFFERENTIAL/PLATELET
Abs Immature Granulocytes: 0 10*3/uL (ref 0.00–0.07)
Basophils Absolute: 0 10*3/uL (ref 0.0–0.1)
Basophils Relative: 0 %
Eosinophils Absolute: 0 10*3/uL (ref 0.0–0.5)
Eosinophils Relative: 1 %
HCT: 36.1 % — ABNORMAL LOW (ref 39.0–52.0)
Hemoglobin: 12.6 g/dL — ABNORMAL LOW (ref 13.0–17.0)
Immature Granulocytes: 0 %
Lymphocytes Relative: 68 %
Lymphs Abs: 3 10*3/uL (ref 0.7–4.0)
MCH: 32.5 pg (ref 26.0–34.0)
MCHC: 34.9 g/dL (ref 30.0–36.0)
MCV: 93 fL (ref 80.0–100.0)
Monocytes Absolute: 0.5 10*3/uL (ref 0.1–1.0)
Monocytes Relative: 11 %
Neutro Abs: 0.9 10*3/uL — ABNORMAL LOW (ref 1.7–7.7)
Neutrophils Relative %: 20 %
Platelets: 308 10*3/uL (ref 150–400)
RBC: 3.88 MIL/uL — ABNORMAL LOW (ref 4.22–5.81)
RDW: 12.8 % (ref 11.5–15.5)
Smear Review: NORMAL
WBC: 4.4 10*3/uL (ref 4.0–10.5)
nRBC: 0 % (ref 0.0–0.2)

## 2021-08-28 LAB — PATHOLOGIST SMEAR REVIEW

## 2021-08-28 MED ORDER — ACETAMINOPHEN 325 MG PO TABS
650.0000 mg | ORAL_TABLET | Freq: Once | ORAL | Status: AC
  Administered 2021-08-28: 650 mg via ORAL
  Filled 2021-08-28: qty 2

## 2021-08-28 MED ORDER — IOHEXOL 350 MG/ML SOLN
75.0000 mL | Freq: Once | INTRAVENOUS | Status: AC | PRN
  Administered 2021-08-28: 75 mL via INTRAVENOUS
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
## 2021-08-28 NOTE — Discharge Instructions (Signed)
You had CAT scans of your head, face, neck, chest, abdomen, pelvis and back which were normal.  Your blood work was also normal.  Please return for any new, worsening, or change in symptoms or other concerns including worsening headache, vision changes, vomiting, numbness, tingling, or any other concerns.  It was a pleasure caring for you today.
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
